# Patient Record
Sex: Female | Born: 1975 | Race: Black or African American | Hispanic: No | Marital: Single | Smoking: Never smoker
Health system: Southern US, Community
[De-identification: ages and names within clinical notes are randomized; demographics above are authoritative.]

## PROBLEM LIST (undated history)

## (undated) DIAGNOSIS — Z973 Presence of spectacles and contact lenses: Secondary | ICD-10-CM

## (undated) DIAGNOSIS — F419 Anxiety disorder, unspecified: Secondary | ICD-10-CM

## (undated) DIAGNOSIS — K219 Gastro-esophageal reflux disease without esophagitis: Secondary | ICD-10-CM

## (undated) DIAGNOSIS — S63399A Traumatic rupture of other ligament of unspecified wrist, initial encounter: Secondary | ICD-10-CM

## (undated) DIAGNOSIS — E7800 Pure hypercholesterolemia, unspecified: Secondary | ICD-10-CM

## (undated) DIAGNOSIS — I10 Essential (primary) hypertension: Secondary | ICD-10-CM

## (undated) DIAGNOSIS — R7303 Prediabetes: Secondary | ICD-10-CM

## (undated) DIAGNOSIS — L309 Dermatitis, unspecified: Secondary | ICD-10-CM

## (undated) HISTORY — PX: HERNIA REPAIR: SHX51

## (undated) HISTORY — PX: TUBAL LIGATION: SHX77

---

## 1998-05-12 ENCOUNTER — Encounter: Admission: RE | Admit: 1998-05-12 | Discharge: 1998-08-10 | Payer: Self-pay | Admitting: Radiation Oncology

## 1998-05-27 ENCOUNTER — Emergency Department (HOSPITAL_COMMUNITY): Admission: EM | Admit: 1998-05-27 | Discharge: 1998-05-27 | Payer: Self-pay | Admitting: Emergency Medicine

## 1998-05-27 ENCOUNTER — Encounter: Payer: Self-pay | Admitting: Emergency Medicine

## 1999-05-25 ENCOUNTER — Emergency Department (HOSPITAL_COMMUNITY): Admission: EM | Admit: 1999-05-25 | Discharge: 1999-05-25 | Payer: Self-pay | Admitting: Emergency Medicine

## 1999-05-25 ENCOUNTER — Encounter: Payer: Self-pay | Admitting: Emergency Medicine

## 2001-06-29 ENCOUNTER — Inpatient Hospital Stay (HOSPITAL_COMMUNITY): Admission: AD | Admit: 2001-06-29 | Discharge: 2001-07-02 | Payer: Self-pay | Admitting: Obstetrics and Gynecology

## 2001-08-13 ENCOUNTER — Other Ambulatory Visit: Admission: RE | Admit: 2001-08-13 | Discharge: 2001-08-13 | Payer: Self-pay | Admitting: Obstetrics and Gynecology

## 2002-07-25 ENCOUNTER — Inpatient Hospital Stay (HOSPITAL_COMMUNITY): Admission: AD | Admit: 2002-07-25 | Discharge: 2002-07-25 | Payer: Self-pay | Admitting: *Deleted

## 2002-08-06 ENCOUNTER — Other Ambulatory Visit: Admission: RE | Admit: 2002-08-06 | Discharge: 2002-08-06 | Payer: Self-pay | Admitting: Obstetrics and Gynecology

## 2002-12-10 ENCOUNTER — Inpatient Hospital Stay (HOSPITAL_COMMUNITY): Admission: AD | Admit: 2002-12-10 | Discharge: 2002-12-10 | Payer: Self-pay | Admitting: Obstetrics and Gynecology

## 2002-12-10 ENCOUNTER — Encounter: Payer: Self-pay | Admitting: Obstetrics and Gynecology

## 2002-12-25 ENCOUNTER — Inpatient Hospital Stay (HOSPITAL_COMMUNITY): Admission: AD | Admit: 2002-12-25 | Discharge: 2002-12-25 | Payer: Self-pay | Admitting: Obstetrics and Gynecology

## 2002-12-28 ENCOUNTER — Inpatient Hospital Stay (HOSPITAL_COMMUNITY): Admission: AD | Admit: 2002-12-28 | Discharge: 2002-12-31 | Payer: Self-pay | Admitting: Obstetrics and Gynecology

## 2003-01-25 ENCOUNTER — Inpatient Hospital Stay (HOSPITAL_COMMUNITY): Admission: AD | Admit: 2003-01-25 | Discharge: 2003-01-25 | Payer: Self-pay | Admitting: Obstetrics and Gynecology

## 2003-03-18 ENCOUNTER — Other Ambulatory Visit: Admission: RE | Admit: 2003-03-18 | Discharge: 2003-03-18 | Payer: Self-pay | Admitting: Obstetrics and Gynecology

## 2003-06-18 ENCOUNTER — Ambulatory Visit (HOSPITAL_COMMUNITY): Admission: RE | Admit: 2003-06-18 | Discharge: 2003-06-18 | Payer: Self-pay | Admitting: Obstetrics and Gynecology

## 2004-04-05 ENCOUNTER — Other Ambulatory Visit: Admission: RE | Admit: 2004-04-05 | Discharge: 2004-04-05 | Payer: Self-pay | Admitting: Obstetrics and Gynecology

## 2004-08-01 ENCOUNTER — Emergency Department (HOSPITAL_COMMUNITY): Admission: EM | Admit: 2004-08-01 | Discharge: 2004-08-01 | Payer: Self-pay | Admitting: Family Medicine

## 2006-01-14 ENCOUNTER — Emergency Department (HOSPITAL_COMMUNITY): Admission: EM | Admit: 2006-01-14 | Discharge: 2006-01-14 | Payer: Self-pay | Admitting: Family Medicine

## 2006-08-02 ENCOUNTER — Emergency Department (HOSPITAL_COMMUNITY): Admission: EM | Admit: 2006-08-02 | Discharge: 2006-08-02 | Payer: Self-pay | Admitting: Emergency Medicine

## 2012-03-28 ENCOUNTER — Emergency Department (HOSPITAL_COMMUNITY)
Admission: EM | Admit: 2012-03-28 | Discharge: 2012-03-29 | Disposition: A | Discharge status: Home / Self Care | Payer: BC Managed Care – PPO | Attending: Emergency Medicine | Admitting: Emergency Medicine

## 2012-03-28 DIAGNOSIS — Z91012 Allergy to eggs: Secondary | ICD-10-CM | POA: Insufficient documentation

## 2012-03-28 DIAGNOSIS — Z888 Allergy status to other drugs, medicaments and biological substances status: Secondary | ICD-10-CM | POA: Insufficient documentation

## 2012-03-28 DIAGNOSIS — R071 Chest pain on breathing: Secondary | ICD-10-CM | POA: Insufficient documentation

## 2012-03-29 ENCOUNTER — Emergency Department (HOSPITAL_COMMUNITY): Payer: BC Managed Care – PPO

## 2012-03-29 ENCOUNTER — Encounter (HOSPITAL_COMMUNITY): Payer: Self-pay | Admitting: Family Medicine

## 2012-03-29 LAB — COMPREHENSIVE METABOLIC PANEL
AST: 18 U/L (ref 0–37)
Albumin: 3.9 g/dL (ref 3.5–5.2)
Calcium: 9.2 mg/dL (ref 8.4–10.5)
Creatinine, Ser: 0.7 mg/dL (ref 0.50–1.10)
Total Protein: 7.4 g/dL (ref 6.0–8.3)

## 2012-03-29 LAB — CBC
MCHC: 32.8 g/dL (ref 30.0–36.0)
RDW: 13.6 % (ref 11.5–15.5)

## 2012-03-29 LAB — TROPONIN I: Troponin I: <0.3 ng/mL (ref <0.30)

## 2012-03-29 LAB — D-DIMER, QUANTITATIVE: D-Dimer, Quant: <0.27 ug/mL-FEU (ref 0.00–0.48)

## 2012-03-29 MED ORDER — ASPIRIN 81 MG PO CHEW
324.0000 mg | CHEWABLE_TABLET | Freq: Once | ORAL | Status: AC
  Administered 2012-03-29: 324 mg via ORAL
  Filled 2012-03-29: qty 4

## 2012-03-29 MED ORDER — HYDROCODONE-ACETAMINOPHEN 5-500 MG PO TABS: 1.0000 | ORAL_TABLET | Freq: Four times a day (QID) | ORAL | Status: DC | PRN

## 2012-03-29 NOTE — ED Notes (Signed)
Patient transported to X-ray 

## 2012-03-29 NOTE — ED Provider Notes (Signed)
History     CSN: [id number]  Arrival date & time 03/28/12  2326   First MD Initiated Contact with Patient 03/29/12 [phone]      Chief Complaint  Patient presents with  . Chest Pain    (Consider location/radiation/quality/duration/timing/severity/associated sxs/prior treatment) HPI  Patient presents to the emergency department with complaints of chest pain that started 3 weeks ago. She states that she has been working out twice a week but this doesn't feel like a pulled muscle. Tonight the pain got worse with some left arm tingling and some sensations of her left neck. She denies having nausea or vomiting associated with it denies having history of GERD. Denies having any shortness of breath, wheezing, recent illness or having syncopal episode. She admits that her dad had a heart attack at age 90. But denies ever having any problems with chest pain previous lead to these recent episodes. Her vital signs are stable and she is in no acute distress.  History reviewed. No pertinent past medical history.  Past Surgical History  Procedure Date  . Tubal ligation   . Hernia repair     No family history on file.  History  Substance Use Topics  . Smoking status: Never Smoker   . Smokeless tobacco: Not on file  . Alcohol Use: No    OB History    Grav Para Term Preterm Abortions TAB SAB Ect Mult Living                  Review of Systems   Review of Systems  Gen: no weight loss, fevers, chills, night sweats  Eyes: no discharge or drainage, no occular pain or visual changes  Nose: no epistaxis or rhinorrhea  Mouth: no dental pain, no sore throat  Neck: no neck pain  Lungs:No wheezing, coughing or hemoptysis CV: + chest pain,  Negative for palpitations, dependent edema or orthopnea  Abd: no abdominal pain, nausea, vomiting  GU: no dysuria or gross hematuria  MSK:  No abnormalities  Neuro: no headache, no focal neurologic deficits  Skin: no abnormalities Psyche:  negative.    Allergies  Avelox and Eggs or egg-derived products  Home Medications   Current Outpatient Rx  Name Route Sig Dispense Refill  . HYDROCODONE-ACETAMINOPHEN 5-500 MG PO TABS Oral Take 1-2 tablets by mouth every 6 (six) hours as needed for pain. 15 tablet 0    BP 153/93  Pulse 72  Temp 98.6 F (37 C) (Oral)  Resp 9  SpO2 100%  LMP 03/06/2012  Physical Exam  Nursing note and vitals reviewed. Constitutional: She appears well-developed and well-nourished. No distress.  HENT:  Head: Normocephalic and atraumatic.  Eyes: Pupils are equal, round, and reactive to light.  Neck: Normal range of motion. Neck supple.  Cardiovascular: Normal rate and regular rhythm.   Pulmonary/Chest: Effort normal. No respiratory distress. She has no wheezes. She has no rales. She exhibits tenderness (left 3rd and 4th rib space).  Abdominal: Soft.  Neurological: She is alert.  Skin: Skin is warm and dry.    ED Course  Procedures (including critical care time)  Labs Reviewed  COMPREHENSIVE METABOLIC PANEL - Abnormal; Notable for the following:    Glucose, Bld 105 (*)     All other components within normal limits  CBC  TROPONIN I  D-DIMER, QUANTITATIVE  TROPONIN I   Dg Chest 2 View  03/29/2012  *RADIOLOGY REPORT*  Clinical Data: Chest pain and SOB  CHEST - 2 VIEW  Comparison: 08/02/2006  Findings: The heart size and mediastinal contours are within normal limits.  Both lungs are clear.  The visualized skeletal structures are unremarkable.  IMPRESSION: Negative exam.   Original Report Authenticated By: Rosealee Albee, M.D.      1. Costochondral chest pain       MDM  She has had 2 negative troponins, negative d-dimer, normal chest x-ray. On physical examination she is very tender to palpation. I recommend that she see her primary care doctor for an outpatient stress test.  Pt given oral analgesics and patient education.  Pt has been advised of the symptoms that warrant their  return to the ED. Patient has voiced understanding and has agreed to follow-up with the PCP or specialist.    Date: 03/29/2012  Rate: 70  Rhythm: normal sinus rhythm  QRS Axis: normal  Intervals: normal  ST/T Wave abnormalities: nonspecific T wave changes  Conduction Disutrbances:none  Narrative Interpretation:   Old EKG Reviewed: none available        Dorthula Matas, PA 03/29/12 0523  Dorthula Matas, PA 03/29/12 [phone]

## 2012-03-29 NOTE — ED Notes (Signed)
Patient states that she started having chest pain 3 weeks ago. States she has been working out but this does not feel like a pulled muscle. Tonite, pain got worse on left side of chest radiating into left arm. States that the left side of her face "feels funny."

## 2012-03-30 NOTE — ED Provider Notes (Signed)
Medical screening examination/treatment/procedure(s) were performed by non-physician practitioner and as supervising physician I was immediately available for consultation/collaboration.  Zuleima Haser, MD 03/30/12 0750 

## 2012-10-21 ENCOUNTER — Encounter (HOSPITAL_COMMUNITY): Payer: Self-pay | Admitting: Emergency Medicine

## 2012-10-21 ENCOUNTER — Emergency Department (HOSPITAL_COMMUNITY)
Admission: EM | Admit: 2012-10-21 | Discharge: 2012-10-21 | Disposition: A | Discharge status: Home / Self Care | Payer: BC Managed Care – PPO | Attending: Emergency Medicine | Admitting: Emergency Medicine

## 2012-10-21 DIAGNOSIS — Z79899 Other long term (current) drug therapy: Secondary | ICD-10-CM | POA: Insufficient documentation

## 2012-10-21 DIAGNOSIS — M545 Low back pain, unspecified: Secondary | ICD-10-CM | POA: Insufficient documentation

## 2012-10-21 DIAGNOSIS — Y9389 Activity, other specified: Secondary | ICD-10-CM | POA: Insufficient documentation

## 2012-10-21 DIAGNOSIS — E7800 Pure hypercholesterolemia, unspecified: Secondary | ICD-10-CM | POA: Insufficient documentation

## 2012-10-21 DIAGNOSIS — R51 Headache: Secondary | ICD-10-CM | POA: Insufficient documentation

## 2012-10-21 DIAGNOSIS — Y92410 Unspecified street and highway as the place of occurrence of the external cause: Secondary | ICD-10-CM | POA: Insufficient documentation

## 2012-10-21 DIAGNOSIS — IMO0002 Reserved for concepts with insufficient information to code with codable children: Secondary | ICD-10-CM | POA: Insufficient documentation

## 2012-10-21 HISTORY — DX: Pure hypercholesterolemia, unspecified: E78.00

## 2012-10-21 MED ORDER — CYCLOBENZAPRINE HCL 10 MG PO TABS: 10.0000 mg | ORAL_TABLET | Freq: Two times a day (BID) | ORAL | Status: DC | PRN
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
## 2012-10-23 NOTE — ED Provider Notes (Signed)
Medical screening examination/treatment/procedure(s) were performed by non-physician practitioner and as supervising physician I was immediately available for consultation/collaboration.   Laray Anger, DO 10/23/12 [phone]

## 2013-04-16 ENCOUNTER — Ambulatory Visit (INDEPENDENT_AMBULATORY_CARE_PROVIDER_SITE_OTHER): Payer: BC Managed Care – PPO | Admitting: Physician Assistant

## 2013-04-16 VITALS — BP 108/80 | HR 74 | Temp 98.2°F | Resp 18 | Ht 63.0 in | Wt 164.8 lb

## 2013-04-16 DIAGNOSIS — J069 Acute upper respiratory infection, unspecified: Secondary | ICD-10-CM

## 2013-04-16 MED ORDER — BENZONATATE 100 MG PO CAPS: 100.0000 mg | ORAL_CAPSULE | Freq: Three times a day (TID) | ORAL | Status: DC | PRN

## 2013-04-16 MED ORDER — HYDROCODONE-HOMATROPINE 5-1.5 MG/5ML PO SYRP: 5.0000 mL | ORAL_SOLUTION | Freq: Three times a day (TID) | ORAL | Status: DC | PRN

## 2013-04-16 MED ORDER — IPRATROPIUM BROMIDE 0.03 % NA SOLN: 2.0000 | Freq: Two times a day (BID) | NASAL | Status: DC

## 2013-04-16 MED ORDER — CETIRIZINE HCL 10 MG PO TABS: 10.0000 mg | ORAL_TABLET | Freq: Every day | ORAL | Status: DC

## 2013-04-16 NOTE — Progress Notes (Signed)
  Subjective:    Patient ID: Eldena Dede, female    DOB: 29-Jul-1975, 37 y.o.   MRN: [id number]  HPI    Ms. Luisa Hart is a very pleasant 37 yr old female here with concern for illness.  Reports she has a cough that started about 6 days ago.  Has gotten worse since.  Thought was just allergies at first.  Also has some runny nose, post-nasal drainage.  The cough is productive of mucus.  Feels a little SOB occ but no wheezing.  No asthma.  No fever.  ST from coughing.  +HA.  Cough is very bothersome, keeps awake at night.  Really does not feel bad, just with nagging cough.  Has used Dayquil and Nyquil with little relief.  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  Review of Systems  Constitutional: Negative for fever and chills.  HENT: Positive for congestion, rhinorrhea and sore throat. Negative for ear pain.   Respiratory: Positive for cough. Negative for shortness of breath and wheezing.   Cardiovascular: Negative.   Gastrointestinal: Negative.   Musculoskeletal: Negative.   Skin: Negative.   Neurological: Negative.        Objective:   Physical Exam  Vitals reviewed. Constitutional: She is oriented to person, place, and time. She appears well-developed and well-nourished. No distress.  HENT:  Head: Normocephalic and atraumatic.  Eyes: Conjunctivae are normal. No scleral icterus.  Neck: Neck supple.  Cardiovascular: Normal rate, regular rhythm and normal heart sounds.  Exam reveals no gallop and no friction rub.   No murmur heard. Pulmonary/Chest: Effort normal and breath sounds normal. She has no wheezes. She has no rales.  Lymphadenopathy:    She has no cervical adenopathy.  Neurological: She is alert and oriented to person, place, and time.  Skin: Skin is warm and dry.  Psychiatric: She has a normal mood and affect. Her behavior is normal.       Assessment & Plan:  Viral URI with cough - Plan: benzonatate (TESSALON) 100 MG capsule, HYDROcodone-homatropine (HYCODAN)  5-1.5 MG/5ML syrup, ipratropium (ATROVENT) 0.03 % nasal spray, cetirizine (ZYRTEC) 10 MG tablet   Ms. Luisa Hart is a very pleasant 37 yr old female with URI, cough.  Suspect viral etiology.  Afebrile, VSS, lungs CTA, throat clear.  Will treat symptoms with zyrtec, atrovent, tessalon, hycodan.  Push fluids, rest.  Work note provided for today and tomorrow if needed.  Discussed RTC precautions including fever, SOB, wheezing, worsening cough.  Pt understands and is in agreement.

## 2013-04-16 NOTE — Patient Instructions (Signed)
Begin using cetirizine (Zyrtec) once daily to help with nasal congestion, runny nose, sneezing, post-nasal drainage  Use the ipratropium (Atrovent) nasal 2-3 times per day to help with runny nose, congestion, post-nasal  Benzonatate (Tessalon) every 8 hours as needed for cough  Can also use Delsym or Robitussin for cough during the day  Use Hycodan syrup for cough at bed time - this may make you sleepy so be careful with the first dose  Plenty of fluids (water is best!) and rest  Please let us know if any symptoms are worsening or not improving   Upper Respiratory Infection, Adult An upper respiratory infection (URI) is also sometimes known as the common cold. The upper respiratory tract includes the nose, sinuses, throat, trachea, and bronchi. Bronchi are the airways leading to the lungs. Most people improve within 1 week, but symptoms can last up to 2 weeks. A residual cough may last even longer.  CAUSES Many different viruses can infect the tissues lining the upper respiratory tract. The tissues become irritated and inflamed and often become very moist. Mucus production is also common. A cold is contagious. You can easily spread the virus to others by oral contact. This includes kissing, sharing a glass, coughing, or sneezing. Touching your mouth or nose and then touching a surface, which is then touched by another person, can also spread the virus. SYMPTOMS  Symptoms typically develop 1 to 3 days after you come in contact with a cold virus. Symptoms vary from person to person. They may include:  Runny nose.  Sneezing.  Nasal congestion.  Sinus irritation.  Sore throat.  Loss of voice (laryngitis).  Cough.  Fatigue.  Muscle aches.  Loss of appetite.  Headache.  Low-grade fever. DIAGNOSIS  You might diagnose your own cold based on familiar symptoms, since most people get a cold 2 to 3 times a year. Your caregiver can confirm this based on your exam. Most importantly,  your caregiver can check that your symptoms are not due to another disease such as strep throat, sinusitis, pneumonia, asthma, or epiglottitis. Blood tests, throat tests, and X-rays are not necessary to diagnose a common cold, but they may sometimes be helpful in excluding other more serious diseases. Your caregiver will decide if any further tests are required. RISKS AND COMPLICATIONS  You may be at risk for a more severe case of the common cold if you smoke cigarettes, have chronic heart disease (such as heart failure) or lung disease (such as asthma), or if you have a weakened immune system. The very young and very old are also at risk for more serious infections. Bacterial sinusitis, middle ear infections, and bacterial pneumonia can complicate the common cold. The common cold can worsen asthma and chronic obstructive pulmonary disease (COPD). Sometimes, these complications can require emergency medical care and may be life-threatening. PREVENTION  The best way to protect against getting a cold is to practice good hygiene. Avoid oral or hand contact with people with cold symptoms. Wash your hands often if contact occurs. There is no clear evidence that vitamin C, vitamin E, echinacea, or exercise reduces the chance of developing a cold. However, it is always recommended to get plenty of rest and practice good nutrition. TREATMENT  Treatment is directed at relieving symptoms. There is no cure. Antibiotics are not effective, because the infection is caused by a virus, not by bacteria. Treatment may include:  Increased fluid intake. Sports drinks offer valuable electrolytes, sugars, and fluids.  Breathing heated mist or  steam (vaporizer or shower).  Eating chicken soup or other clear broths, and maintaining good nutrition.  Getting plenty of rest.  Using gargles or lozenges for comfort.  Controlling fevers with ibuprofen or acetaminophen as directed by your caregiver.  Increasing usage of your  inhaler if you have asthma. Zinc gel and zinc lozenges, taken in the first 24 hours of the common cold, can shorten the duration and lessen the severity of symptoms. Pain medicines may help with fever, muscle aches, and throat pain. A variety of non-prescription medicines are available to treat congestion and runny nose. Your caregiver can make recommendations and may suggest nasal or lung inhalers for other symptoms.  HOME CARE INSTRUCTIONS   Only take over-the-counter or prescription medicines for pain, discomfort, or fever as directed by your caregiver.  Use a warm mist humidifier or inhale steam from a shower to increase air moisture. This may keep secretions moist and make it easier to breathe.  Drink enough water and fluids to keep your urine clear or pale yellow.  Rest as needed.  Return to work when your temperature has returned to normal or as your caregiver advises. You may need to stay home longer to avoid infecting others. You can also use a face mask and careful hand washing to prevent spread of the virus. SEEK MEDICAL CARE IF:   After the first few days, you feel you are getting worse rather than better.  You need your caregiver's advice about medicines to control symptoms.  You develop chills, worsening shortness of breath, or brown or red sputum. These may be signs of pneumonia.  You develop yellow or brown nasal discharge or pain in the face, especially when you bend forward. These may be signs of sinusitis.  You develop a fever, swollen neck glands, pain with swallowing, or white areas in the back of your throat. These may be signs of strep throat. SEEK IMMEDIATE MEDICAL CARE IF:   You have a fever.  You develop severe or persistent headache, ear pain, sinus pain, or chest pain.  You develop wheezing, a prolonged cough, cough up blood, or have a change in your usual mucus (if you have chronic lung disease).  You develop sore muscles or a stiff neck. Document  Released: 12/19/2000 Document Revised: 09/17/2011 Document Reviewed: 10/27/2010 [facility] Patient Information 2014 [place], [place].

## 2013-05-02 ENCOUNTER — Ambulatory Visit (INDEPENDENT_AMBULATORY_CARE_PROVIDER_SITE_OTHER): Payer: BC Managed Care – PPO | Admitting: Family Medicine

## 2013-05-02 ENCOUNTER — Ambulatory Visit: Payer: BC Managed Care – PPO

## 2013-05-02 VITALS — BP 128/82 | HR 76 | Temp 98.9°F | Resp 16 | Ht 63.0 in | Wt 162.0 lb

## 2013-05-02 DIAGNOSIS — T148XXA Other injury of unspecified body region, initial encounter: Secondary | ICD-10-CM

## 2013-05-02 DIAGNOSIS — M79645 Pain in left finger(s): Secondary | ICD-10-CM

## 2013-05-02 DIAGNOSIS — M79609 Pain in unspecified limb: Secondary | ICD-10-CM

## 2013-05-02 DIAGNOSIS — M79642 Pain in left hand: Secondary | ICD-10-CM

## 2013-05-02 NOTE — Progress Notes (Signed)
[facility]:  Office Visit  Chief Complaint:  Chief Complaint  Patient presents with  . Hand Injury    L thumb x last night, used ice     HPI: Melinda Valentine is a 37 y.o. female who is here for  3/10 pain and swelling of left base of thumb s/p running into wall, running and jammed her finger into the door.  She has dull pain, intermittent, worse when she moves. + swelling and bruising . No n/w/t Has tried ice for it.  No osteopenia/osteoprosis or Lupus.  No prior hand injury LMP 101/11/4  Past Medical History  Diagnosis Date  . Hypercholesteremia    Past Surgical History  Procedure Laterality Date  . Tubal ligation    . Hernia repair     History   Social History  . Marital Status: Married    Spouse Name: N/A    Number of Children: N/A  . Years of Education: N/A   Social History Main Topics  . Smoking status: Never Smoker   . Smokeless tobacco: None  . Alcohol Use: No  . Drug Use: No  . Sexual Activity: Yes   Other Topics Concern  . None   Social History Narrative  . None   Family History  Problem Relation Age of Onset  . Hyperlipidemia Mother   . Heart disease Father   . Cancer Maternal Grandmother   . Cancer Maternal Aunt    Allergies  Allergen Reactions  . Avelox [Moxifloxacin Hcl In Nacl] Other (See Comments)    "body feels numb"  . Eggs Or Egg-Derived Products Nausea And Vomiting   Prior to Admission medications   Medication Sig Start Date End Date Taking? Authorizing Provider  ipratropium (ATROVENT) 0.03 % nasal spray Place 2 sprays into the nose 2 (two) times daily as needed. 04/16/13  Yes Eleanore E Debbra Riding, PA-C     ROS: The patient denies fevers, chills, night sweats, unintentional weight loss, chest pain, palpitations, wheezing, dyspnea on exertion, nausea, vomiting, abdominal pain, dysuria, hematuria, melena, numbness, weakness, or tingling.  All other systems have been reviewed and were otherwise negative with the  exception of those mentioned in the HPI and as above.    PHYSICAL EXAM: Filed Vitals:   05/02/13 1504  BP: 128/82  Pulse: 76  Temp: 98.9 F (37.2 C)  Resp: 16   Filed Vitals:   05/02/13 1504  Height: 5\' 3"  (1.6 m)  Weight: 162 lb (73.483 kg)   Body mass index is 28.7 kg/(m^2).  General: Alert, no acute distress HEENT:  Normocephalic, atraumatic, oropharynx patent. EOMI, PERRLA Cardiovascular:  Regular rate and rhythm, no rubs murmurs or gallops.  No Carotid bruits, radial pulse intact. No pedal edema.  Respiratory: Clear to auscultation bilaterally.  No wheezes, rales, or rhonchi.  No cyanosis, no use of accessory musculature GI: No organomegaly, abdomen is soft and non-tender, positive bowel sounds.  No masses. Skin: No rashes. Neurologic: Facial musculature symmetric. Psychiatric: Patient is appropriate throughout our interaction. Lymphatic: No cervical lymphadenopathy Musculoskeletal: Gait intact. Left base thumb tenderness UCL was intact 5/5 strength, sesntation intact + radial pulse  LABS: Results for orders placed during the hospital encounter of 03/28/12  CBC      Result Value Range   WBC 6.9  4.0 - 10.5 K/uL   RBC 5.05  3.87 - 5.11 MIL/uL   Hemoglobin 13.3  12.0 - 15.0 g/dL   HCT 16.1  09.6 - 04.5 %   MCV 80.2  78.0 - 100.0 fL   MCH 26.3  26.0 - 34.0 pg   MCHC 32.8  30.0 - 36.0 g/dL   RDW 16.1  09.6 - 04.5 %   Platelets 211  150 - 400 K/uL  TROPONIN I      Result Value Range   Troponin I <0.30  <0.30 ng/mL  D-DIMER, QUANTITATIVE      Result Value Range   D-Dimer, Quant <0.27  0.00 - 0.48 ug/mL-FEU  COMPREHENSIVE METABOLIC PANEL      Result Value Range   Sodium 138  135 - 145 mEq/L   Potassium 3.6  3.5 - 5.1 mEq/L   Chloride 103  96 - 112 mEq/L   CO2 25  19 - 32 mEq/L   Glucose, Bld 105 (*) 70 - 99 mg/dL   BUN 8  6 - 23 mg/dL   Creatinine, Ser 4.09  0.50 - 1.10 mg/dL   Calcium 9.2  8.4 - 81.1 mg/dL   Total Protein 7.4  6.0 - 8.3 g/dL   Albumin 3.9   3.5 - 5.2 g/dL   AST 18  0 - 37 U/L   ALT 28  0 - 35 U/L   Alkaline Phosphatase 62  39 - 117 U/L   Total Bilirubin 0.5  0.3 - 1.2 mg/dL   GFR calc non Af Amer >90  >90 mL/min   GFR calc Af Amer >90  >90 mL/min  TROPONIN I      Result Value Range   Troponin I <0.30  <0.30 ng/mL     EKG/XRAY:   Primary read interpreted by Dr. Conley Rolls at [facility]. No fx/dislocation  ASSESSMENT/PLAN: Encounter Diagnoses  Name Primary?  . Thumb pain, left Yes  . Hand pain, left   . Sprain and strain    Extensor tendon sprain/strain ? MCP spraina dn strain, her UCL is intact Thumb spica and RICE and ibuprofen otc F/u in 1-2 weeks if no improvement or prn Gross sideeffects, risk and benefits, and alternatives of medications d/w patient. Patient is aware that all medications have potential sideeffects and we are unable to predict every sideeffect or drug-drug interaction that may occur.  Hamilton Capri PHUONG, DO 05/02/2013 4:47 PM

## 2018-06-04 ENCOUNTER — Emergency Department (HOSPITAL_COMMUNITY)
Admission: EM | Admit: 2018-06-04 | Discharge: 2018-06-04 | Disposition: A | Discharge status: Home / Self Care | Payer: Commercial Managed Care - PPO | Attending: Emergency Medicine | Admitting: Emergency Medicine

## 2018-06-04 ENCOUNTER — Encounter (HOSPITAL_COMMUNITY): Payer: Self-pay

## 2018-06-04 ENCOUNTER — Emergency Department (HOSPITAL_COMMUNITY): Payer: Commercial Managed Care - PPO

## 2018-06-04 DIAGNOSIS — W01198A Fall on same level from slipping, tripping and stumbling with subsequent striking against other object, initial encounter: Secondary | ICD-10-CM | POA: Diagnosis not present

## 2018-06-04 DIAGNOSIS — Y9389 Activity, other specified: Secondary | ICD-10-CM | POA: Insufficient documentation

## 2018-06-04 DIAGNOSIS — M25532 Pain in left wrist: Secondary | ICD-10-CM | POA: Diagnosis not present

## 2018-06-04 DIAGNOSIS — Y998 Other external cause status: Secondary | ICD-10-CM | POA: Diagnosis not present

## 2018-06-04 DIAGNOSIS — M25512 Pain in left shoulder: Secondary | ICD-10-CM | POA: Diagnosis not present

## 2018-06-04 DIAGNOSIS — S161XXA Strain of muscle, fascia and tendon at neck level, initial encounter: Secondary | ICD-10-CM | POA: Diagnosis not present

## 2018-06-04 DIAGNOSIS — S199XXA Unspecified injury of neck, initial encounter: Secondary | ICD-10-CM | POA: Diagnosis present

## 2018-06-04 DIAGNOSIS — W19XXXA Unspecified fall, initial encounter: Secondary | ICD-10-CM

## 2018-06-04 DIAGNOSIS — Y9289 Other specified places as the place of occurrence of the external cause: Secondary | ICD-10-CM | POA: Diagnosis not present

## 2018-06-04 MED ORDER — IBUPROFEN 800 MG PO TABS: 800.0000 mg | ORAL_TABLET | Freq: Three times a day (TID) | ORAL | 0 refills | Status: DC

## 2018-06-04 MED ORDER — CYCLOBENZAPRINE HCL 10 MG PO TABS: 10.0000 mg | ORAL_TABLET | Freq: Two times a day (BID) | ORAL | 0 refills | Status: DC | PRN

## 2018-06-04 NOTE — ED Triage Notes (Signed)
Pt states that she had a mechanical fall tonight while getting something out of a storage building, c/o of pain to bilateral shoulders, hit head, no LOC, not on blood thinners. C/o of headache.

## 2018-06-04 NOTE — ED Notes (Signed)
ED Provider at bedside. 

## 2018-06-04 NOTE — ED Provider Notes (Signed)
[facility]   CSN: [id number] Arrival date & time: 06/04/18  2112     History   Chief Complaint Chief Complaint  Patient presents with  . Fall    HPI Melinda Valentine is a 42 y.o. female.  Patient presents to the emergency department with a chief complaint of slip and fall.  She states that she slipped on wet wood and fell backward landing on her back.  She did hit her head.  She is uncertain whether she passed out.  She denies any vomiting.  Denies any seizure.  Denies any weakness, numbness, or tingling.  Denies any vision changes or slurred speech.  She complains of left shoulder pain and left forearm pain.  She also complains of muscle soreness in her neck and back.  She denies any other injuries.  She has not taken anything for symptoms.  Her symptoms are aggravated with movement and palpation.  The history is provided by the patient. No language interpreter was used.    Past Medical History:  Diagnosis Date  . Hypercholesteremia     There are no active problems to display for this patient.   Past Surgical History:  Procedure Laterality Date  . HERNIA REPAIR    . TUBAL LIGATION       OB History   None      Home Medications    Prior to Admission medications   Medication Sig Start Date End Date Taking? Authorizing Provider  ipratropium (ATROVENT) 0.03 % nasal spray Place 2 sprays into the nose 2 (two) times daily as needed. 04/16/13   Godfrey PickEgan, Eleanore E, PA-C    Family History Family History  Problem Relation Age of Onset  . Hyperlipidemia Mother   . Heart disease Father   . Cancer Maternal Grandmother   . Cancer Maternal Aunt     Social History Social History   Tobacco Use  . Smoking status: Never Smoker  . Smokeless tobacco: Never Used  Substance Use Topics  . Alcohol use: No  . Drug use: No     Allergies   Avelox [moxifloxacin hcl in nacl] and Eggs or egg-derived products   Review of  Systems Review of Systems  All other systems reviewed and are negative.    Physical Exam Updated Vital Signs BP 136/89   Pulse 85   Temp 98.5 F (36.9 C) (Oral)   Resp 20   LMP 06/04/2018   SpO2 96%   Physical Exam  Constitutional: She is oriented to person, place, and time. No distress.  HENT:  Head: Normocephalic and atraumatic.  No battle sign, no evidence of skull fracture  Eyes: Pupils are equal, round, and reactive to light. Conjunctivae and EOM are normal.  Neck: Normal range of motion. No tracheal deviation present.  Normal range of motion and strength neck, no cervical spine tenderness, step-off, or deformity  Cardiovascular: Normal rate.  Pulmonary/Chest: Effort normal. No respiratory distress.  Abdominal: Soft.  Musculoskeletal: Normal range of motion.  Normal range of motion of left upper extremity, though there is some tenderness to palpation over the scapula and left medial elbow and left wrist diffusely, no bony abnormality or deformity  Neurological: She is alert and oriented to person, place, and time.  Skin: Skin is warm and dry. She is not diaphoretic.  Psychiatric: Judgment normal.  Nursing note and vitals reviewed.    ED Treatments / Results  Labs (all labs ordered are listed, but only abnormal results are  displayed) Labs Reviewed - No data to display  EKG None  Radiology No results found.  Procedures Procedures (including critical care time)  Medications Ordered in ED Medications - No data to display   Initial Impression / Assessment and Plan / ED Course  I have reviewed the triage vital signs and the nursing notes.  Pertinent labs & imaging results that were available during my care of the patient were reviewed by me and considered in my medical decision making (see chart for details).     Patient with mechanical fall.  She slipped and fell on wet decking.  Landed on her back.  Did not pass out.  Complains of left shoulder and left  wrist pain.  Imaging is negative.  Likely just sprains and strains.  Will splint wrist for comfort.  Recommend PCP follow-up.  Final Clinical Impressions(s) / ED Diagnoses   Final diagnoses:  Fall, initial encounter  Acute pain of left shoulder  Strain of neck muscle, initial encounter  Left wrist pain    ED Discharge Orders         Ordered    ibuprofen (ADVIL,MOTRIN) 800 MG tablet  3 times daily     06/04/18 2259    cyclobenzaprine (FLEXERIL) 10 MG tablet  2 times daily PRN     06/04/18 2259           Roxy Horseman, PA-C 06/04/18 2300    Cathren Laine, MD 06/05/18 0005

## 2019-06-13 DIAGNOSIS — Z8616 Personal history of COVID-19: Secondary | ICD-10-CM

## 2019-06-13 HISTORY — DX: Personal history of COVID-19: Z86.16

## 2019-09-03 ENCOUNTER — Ambulatory Visit: Payer: Commercial Managed Care - PPO | Attending: Internal Medicine

## 2019-09-03 DIAGNOSIS — Z23 Encounter for immunization: Secondary | ICD-10-CM | POA: Insufficient documentation

## 2019-09-03 NOTE — Progress Notes (Signed)
   Covid-19 Vaccination Clinic  Name:  Melinda Valentine    MRN: [id number] DOB: Sep 19, 1975  09/03/2019  Melinda Valentine was observed post Covid-19 immunization for 15 minutes without incidence. She was provided with Vaccine Information Sheet and instruction to access the V-Safe system.   Melinda Valentine was instructed to call 911 with any severe reactions post vaccine: Marland Kitchen Difficulty breathing  . Swelling of your face and throat  . A fast heartbeat  . A bad rash all over your body  . Dizziness and weakness    Immunizations Administered    Name Date Dose VIS Date Route   Pfizer COVID-19 Vaccine 09/03/2019  2:11 PM 0.3 mL 06/19/2019 Intramuscular   Manufacturer: [employer], [employer]   Lot: [id number]   NDC: [address]-9

## 2019-09-29 ENCOUNTER — Ambulatory Visit: Payer: Commercial Managed Care - PPO | Attending: Internal Medicine

## 2019-09-29 DIAGNOSIS — Z23 Encounter for immunization: Secondary | ICD-10-CM

## 2019-09-29 NOTE — Progress Notes (Signed)
   Covid-19 Vaccination Clinic  Name:  Melinda Valentine    MRN: [id number] DOB: 08/14/1975  09/29/2019  [name] was observed post Covid-19 immunization for 15 minutes without incident. She was provided with Vaccine Information Sheet and instruction to access the V-Safe system.   [name] was instructed to call 911 with any severe reactions post vaccine: Marland Kitchen Difficulty breathing  . Swelling of face and throat  . A fast heartbeat  . A bad rash all over body  . Dizziness and weakness   Immunizations Administered    Name Date Dose VIS Date Route   Pfizer COVID-19 Vaccine 09/29/2019  9:45 AM 0.3 mL 06/19/2019 Intramuscular   Manufacturer: [employer], [employer]   Lot: [id number]   NDC: [address]-2      Covid-19 Vaccination Clinic  Name:  Melinda Valentine    MRN: [id number] DOB: 1976/06/28  09/29/2019  [name] was observed post Covid-19 immunization for 15 minutes without incident. She was provided with Vaccine Information Sheet and instruction to access the V-Safe system.   [name] was instructed to call 911 with any severe reactions post vaccine: Marland Kitchen Difficulty breathing  . Swelling of face and throat  . A fast heartbeat  . A bad rash all over body  . Dizziness and weakness   Immunizations Administered    Name Date Dose VIS Date Route   Pfizer COVID-19 Vaccine 09/29/2019  9:45 AM 0.3 mL 06/19/2019 Intramuscular   Manufacturer: [employer], [employer]   Lot: [id number]   NDC: [address]-4

## 2021-04-04 DIAGNOSIS — S63399A Traumatic rupture of other ligament of unspecified wrist, initial encounter: Secondary | ICD-10-CM

## 2021-04-04 HISTORY — DX: Traumatic rupture of other ligament of unspecified wrist, initial encounter: S63.399A

## 2021-04-14 ENCOUNTER — Encounter (HOSPITAL_BASED_OUTPATIENT_CLINIC_OR_DEPARTMENT_OTHER): Payer: Self-pay | Admitting: Orthopedic Surgery

## 2021-04-17 ENCOUNTER — Encounter (HOSPITAL_BASED_OUTPATIENT_CLINIC_OR_DEPARTMENT_OTHER): Payer: Self-pay | Admitting: Orthopedic Surgery

## 2021-04-17 ENCOUNTER — Other Ambulatory Visit: Payer: Self-pay

## 2021-04-17 NOTE — Progress Notes (Signed)
Spoke w/ via phone for pre-op interview---pt Lab needs dos----   I stat, ekg urine poct per anesthesia surgery orders pending            Lab results------none COVID test -----patient states asymptomatic no test needed Arrive at -------845 am 04-20-2021 NPO after MN NO Solid Food.  Clear liquids from MN until---745 am Med rec completed Medications to take morning of surgery -----albuterol inhler prn/use bring, fluoxetine, claritin, famotidine Diabetic medication -----n/a Patient instructed no nail polish to be worn day of surgery Patient instructed to bring photo id and insurance card day of surgery Patient aware to have Driver (ride ) / caregiver    for 24 hours after surgery  partner Melinda Valentine Patient Special Instructions -----none Pre-Op special Istructions -----surgery orders requested lm with Morrie Sheldon Patient verbalized understanding of instructions that were given at this phone interview. Patient denies shortness of breath, chest pain, fever, cough at this phone interview.

## 2021-04-18 NOTE — H&P (Signed)
Preoperative History & Physical Exam  Surgeon: Philipp Ovens, MD  Diagnosis: right scapholunate ligament rupture  Planned Procedure: Procedure(s) (LRB): Right scapholunate ligament repair possible reconstruction with palmaris longus tendon (Right)  History of Present Illness:   Patient is a 45 y.o. female with symptoms consistent with  right scapholunate ligament rupture who presents for surgical intervention. The risks, benefits and alternatives of surgical intervention were discussed and informed consent was obtained prior to surgery.  Past Medical History:  Past Medical History:  Diagnosis Date   Anxiety    Eczema    GERD (gastroesophageal reflux disease)    History of COVID-19 06/13/2019   positive result in care everywhere fatigue sob all symptoms resolved   Hypercholesteremia    Hypertension    Pre-diabetes    last hemaglobin a 1 c 5. 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 Authorizing Provider  albuterol (VENTOLIN HFA) 108 (90 Base) MCG/ACT inhaler Inhale 2 puffs into the lungs every 6 (six) hours as needed for wheezing or shortness of breath.   Yes [provider]  atorvastatin (LIPITOR) 40 MG tablet Take 40 mg by mouth at bedtime.   Yes [provider]  famotidine (PEPCID) 20 MG tablet Take 20 mg by mouth 2 (two) times daily.   Yes [provider]  FLUoxetine (PROZAC) 20 MG tablet Take 20 mg by mouth daily.   Yes [provider]  fluticasone (FLONASE) 50 MCG/ACT nasal spray Place 2 sprays into both nostrils daily as needed for allergies or rhinitis.   Yes [provider]  hydrochlorothiazide (HYDRODIURIL) 25 MG tablet Take 25 mg by mouth daily.   Yes [provider]  levonorgestrel (MIRENA) 20 MCG/DAY IUD 1 each by Intrauterine route once.   Yes [provider]  loratadine (CLARITIN) 10 MG tablet Take 10 mg by mouth daily.   Yes [provider]  meloxicam (MOBIC) 7.5 MG tablet Take 7.5 mg by mouth every evening.   Yes [provider]  triamcinolone cream (KENALOG) 0.5 % Apply 1 application topically as needed.   Yes [provider]    Allergies:  Avelox [moxifloxacin hcl in nacl] and Eggs or egg-derived products  Review of Systems: Negative except per HPI.  Physical Exam: Alert and oriented, NAD Head and neck: no masses, normal alignment CV: pulse intact Pulm: no increased work of breathing, respirations even and unlabored Abdomen: non-distended Extremities: extremities warm and well perfused  LABS: No results found for this or any previous visit (from the past 2160 hour(s)).   Complete History and Physical exam available in the office notes  Gomez Cleverly

## 2021-04-20 ENCOUNTER — Ambulatory Visit (HOSPITAL_BASED_OUTPATIENT_CLINIC_OR_DEPARTMENT_OTHER): Payer: Commercial Managed Care - PPO | Admitting: Anesthesiology

## 2021-04-20 ENCOUNTER — Ambulatory Visit (HOSPITAL_BASED_OUTPATIENT_CLINIC_OR_DEPARTMENT_OTHER)
Admission: RE | Admit: 2021-04-20 | Discharge: 2021-04-20 | Disposition: A | Discharge status: Home / Self Care | Payer: Commercial Managed Care - PPO | Source: Ambulatory Visit | Attending: Orthopedic Surgery | Admitting: Orthopedic Surgery

## 2021-04-20 ENCOUNTER — Encounter (HOSPITAL_BASED_OUTPATIENT_CLINIC_OR_DEPARTMENT_OTHER): Payer: Self-pay | Admitting: Orthopedic Surgery

## 2021-04-20 ENCOUNTER — Encounter (HOSPITAL_BASED_OUTPATIENT_CLINIC_OR_DEPARTMENT_OTHER)
Admission: RE | Disposition: A | Discharge status: Home / Self Care | Payer: Self-pay | Source: Ambulatory Visit | Attending: Orthopedic Surgery

## 2021-04-20 DIAGNOSIS — Z881 Allergy status to other antibiotic agents status: Secondary | ICD-10-CM | POA: Diagnosis not present

## 2021-04-20 DIAGNOSIS — Z793 Long term (current) use of hormonal contraceptives: Secondary | ICD-10-CM | POA: Insufficient documentation

## 2021-04-20 DIAGNOSIS — Z975 Presence of (intrauterine) contraceptive device: Secondary | ICD-10-CM | POA: Diagnosis not present

## 2021-04-20 DIAGNOSIS — X58XXXA Exposure to other specified factors, initial encounter: Secondary | ICD-10-CM | POA: Diagnosis not present

## 2021-04-20 DIAGNOSIS — Z791 Long term (current) use of non-steroidal anti-inflammatories (NSAID): Secondary | ICD-10-CM | POA: Insufficient documentation

## 2021-04-20 DIAGNOSIS — Z8616 Personal history of COVID-19: Secondary | ICD-10-CM | POA: Diagnosis not present

## 2021-04-20 DIAGNOSIS — Z79899 Other long term (current) drug therapy: Secondary | ICD-10-CM | POA: Diagnosis not present

## 2021-04-20 DIAGNOSIS — M25331 Other instability, right wrist: Secondary | ICD-10-CM

## 2021-04-20 DIAGNOSIS — S638X1A Sprain of other part of right wrist and hand, initial encounter: Secondary | ICD-10-CM | POA: Insufficient documentation

## 2021-04-20 HISTORY — DX: Prediabetes: R73.03

## 2021-04-20 HISTORY — DX: Essential (primary) hypertension: I10

## 2021-04-20 HISTORY — DX: Anxiety disorder, unspecified: F41.9

## 2021-04-20 HISTORY — DX: Traumatic rupture of other ligament of unspecified wrist, initial encounter: S63.399A

## 2021-04-20 HISTORY — PX: ULNAR COLLATERAL LIGAMENT REPAIR: SHX6159

## 2021-04-20 HISTORY — DX: Gastro-esophageal reflux disease without esophagitis: K21.9

## 2021-04-20 HISTORY — DX: Dermatitis, unspecified: L30.9

## 2021-04-20 HISTORY — DX: Presence of spectacles and contact lenses: Z97.3

## 2021-04-20 LAB — POCT I-STAT, CHEM 8
BUN: 9 mg/dL (ref 6–20)
Calcium, Ion: 1.22 mmol/L (ref 1.15–1.40)
Chloride: 104 mmol/L (ref 98–111)
Creatinine, Ser: 0.7 mg/dL (ref 0.44–1.00)
Glucose, Bld: 100 mg/dL — ABNORMAL HIGH (ref 70–99)
HCT: 41 % (ref 36.0–46.0)
Hemoglobin: 13.9 g/dL (ref 12.0–15.0)
Potassium: 3.5 mmol/L (ref 3.5–5.1)
Sodium: 143 mmol/L (ref 135–145)
TCO2: 27 mmol/L (ref 22–32)

## 2021-04-20 LAB — POCT PREGNANCY, URINE: Preg Test, Ur: NEGATIVE

## 2021-04-20 SURGERY — REPAIR, LIGAMENT, ULNAR COLLATERAL
Anesthesia: Monitor Anesthesia Care | Site: Wrist | Laterality: Right

## 2021-04-20 MED ORDER — ROPIVACAINE HCL 5 MG/ML IJ SOLN
INTRAMUSCULAR | Status: DC | PRN
  Administered 2021-04-20: 30 mL via PERINEURAL

## 2021-04-20 MED ORDER — CEFAZOLIN SODIUM-DEXTROSE 2-4 GM/100ML-% IV SOLN
2.0000 g | INTRAVENOUS | Status: AC
  Administered 2021-04-20: 2 g via INTRAVENOUS

## 2021-04-20 MED ORDER — OXYCODONE HCL 5 MG PO TABS: 5.0000 mg | ORAL_TABLET | Freq: Once | ORAL | Status: DC | PRN

## 2021-04-20 MED ORDER — FENTANYL CITRATE (PF) 100 MCG/2ML IJ SOLN: 25.0000 ug | INTRAMUSCULAR | Status: DC | PRN

## 2021-04-20 MED ORDER — LACTATED RINGERS IV SOLN: INTRAVENOUS | Status: DC

## 2021-04-20 MED ORDER — MIDAZOLAM HCL 2 MG/2ML IJ SOLN
INTRAMUSCULAR | Status: AC
  Filled 2021-04-20: qty 2

## 2021-04-20 MED ORDER — MIDAZOLAM HCL 2 MG/2ML IJ SOLN
2.0000 mg | Freq: Once | INTRAMUSCULAR | Status: AC
  Administered 2021-04-20: 2 mg via INTRAVENOUS

## 2021-04-20 MED ORDER — WHITE PETROLATUM EX OINT
TOPICAL_OINTMENT | CUTANEOUS | Status: AC
  Filled 2021-04-20: qty 5

## 2021-04-20 MED ORDER — LABETALOL HCL 5 MG/ML IV SOLN
INTRAVENOUS | Status: DC | PRN
  Administered 2021-04-20: 5 mg via INTRAVENOUS

## 2021-04-20 MED ORDER — OXYCODONE HCL 5 MG/5ML PO SOLN: 5.0000 mg | Freq: Once | ORAL | Status: DC | PRN

## 2021-04-20 MED ORDER — FENTANYL CITRATE (PF) 100 MCG/2ML IJ SOLN
INTRAMUSCULAR | Status: AC
  Filled 2021-04-20: qty 2

## 2021-04-20 MED ORDER — ONDANSETRON HCL 4 MG/2ML IJ SOLN: 4.0000 mg | Freq: Once | INTRAMUSCULAR | Status: DC | PRN

## 2021-04-20 MED ORDER — FENTANYL CITRATE (PF) 100 MCG/2ML IJ SOLN
50.0000 ug | Freq: Once | INTRAMUSCULAR | Status: AC
  Administered 2021-04-20: 50 ug via INTRAVENOUS

## 2021-04-20 MED ORDER — PROPOFOL 10 MG/ML IV BOLUS
INTRAVENOUS | Status: DC | PRN
  Administered 2021-04-20: 100 ug/kg/min via INTRAVENOUS

## 2021-04-20 MED ORDER — CEFAZOLIN SODIUM-DEXTROSE 2-4 GM/100ML-% IV SOLN
INTRAVENOUS | Status: AC
  Filled 2021-04-20: qty 100

## 2021-04-20 MED ORDER — OXYCODONE-ACETAMINOPHEN 5-325 MG PO TABS: 1.0000 | ORAL_TABLET | Freq: Four times a day (QID) | ORAL | 0 refills | Status: AC | PRN

## 2021-04-20 SURGICAL SUPPLY — 55 items
ADH SKN CLS APL DERMABOND .7 (GAUZE/BANDAGES/DRESSINGS) ×1
ANCH SUT SWVLOCK 8.5 (Anchor) ×1 IMPLANT
ANCHOR DX SWIVELOCK SL 3.5X8.5 (Anchor) ×1 IMPLANT
ANCHOR REPAIR HAND WRIST (Orthopedic Implant) ×1 IMPLANT
APL SKNCLS STERI-STRIP NONHPOA (GAUZE/BANDAGES/DRESSINGS) ×1
BENZOIN TINCTURE PRP APPL 2/3 (GAUZE/BANDAGES/DRESSINGS) ×2 IMPLANT
BLADE SURG 15 STRL LF DISP TIS (BLADE) ×2 IMPLANT
BLADE SURG 15 STRL SS (BLADE) ×4
BNDG CMPR 9X4 STRL LF SNTH (GAUZE/BANDAGES/DRESSINGS) ×1
BNDG ELASTIC 4X5.8 VLCR STR LF (GAUZE/BANDAGES/DRESSINGS) ×2 IMPLANT
BNDG ESMARK 4X9 LF (GAUZE/BANDAGES/DRESSINGS) ×2 IMPLANT
BNDG PLASTER FAST 3X3 WHT LF (CAST SUPPLIES) ×1 IMPLANT
BNDG PLSTR 3X3 XFST ST WHT LF (CAST SUPPLIES) ×1
CORD BIPOLAR FORCEPS 12FT (ELECTRODE) ×2 IMPLANT
COVER BACK TABLE 60X90IN (DRAPES) ×2 IMPLANT
DECANTER SPIKE VIAL GLASS SM (MISCELLANEOUS) IMPLANT
DERMABOND ADVANCED (GAUZE/BANDAGES/DRESSINGS) ×1
DERMABOND ADVANCED .7 DNX12 (GAUZE/BANDAGES/DRESSINGS) ×1 IMPLANT
DRAPE EXTREMITY T 121X128X90 (DISPOSABLE) ×2 IMPLANT
DRAPE OEC MINIVIEW 54X84 (DRAPES) ×2 IMPLANT
DRAPE SHEET LG 3/4 BI-LAMINATE (DRAPES) ×2 IMPLANT
DRAPE SURG 17X23 STRL (DRAPES) ×2 IMPLANT
ELECT REM PT RETURN 9FT ADLT (ELECTROSURGICAL) ×2
ELECTRODE REM PT RTRN 9FT ADLT (ELECTROSURGICAL) ×1 IMPLANT
GAUZE 4X4 16PLY ~~LOC~~+RFID DBL (SPONGE) ×2 IMPLANT
GAUZE SPONGE 4X4 12PLY STRL (GAUZE/BANDAGES/DRESSINGS) ×2 IMPLANT
GAUZE SPONGE 4X4 12PLY STRL LF (GAUZE/BANDAGES/DRESSINGS) ×1 IMPLANT
GLOVE SURG ENC MOIS LTX SZ7.5 (GLOVE) ×2 IMPLANT
GOWN STRL REUS W/TWL LRG LVL3 (GOWN DISPOSABLE) ×2 IMPLANT
K-WIRE CAPS STERILE WHITE .045 (WIRE) ×1 IMPLANT
K-WIRE DBL END TROCAR 6X.045 (WIRE) ×6
K-WIRE DBL END TROCAR 6X.062 (WIRE) ×2
KWIRE DBL END TROCAR 6X.045 (WIRE) IMPLANT
KWIRE DBL END TROCAR 6X.062 (WIRE) IMPLANT
NDL HYPO 25X1 1.5 SAFETY (NEEDLE) ×1 IMPLANT
NEEDLE HYPO 25X1 1.5 SAFETY (NEEDLE) ×2 IMPLANT
NS IRRIG 1000ML POUR BTL (IV SOLUTION) ×2 IMPLANT
PACK BASIN DAY SURGERY FS (CUSTOM PROCEDURE TRAY) ×2 IMPLANT
PAD CAST 4YDX4 CTTN HI CHSV (CAST SUPPLIES) ×1 IMPLANT
PAD CAST CTTN 4X4 STRL (SOFTGOODS) IMPLANT
PADDING CAST ABS 4INX4YD NS (CAST SUPPLIES) ×1
PADDING CAST ABS COTTON 4X4 ST (CAST SUPPLIES) ×1 IMPLANT
PADDING CAST COTTON 4X4 STRL (CAST SUPPLIES) ×2
PADDING CAST COTTON 4X4 STRL (SOFTGOODS) ×2
SLING ARM FOAM STRAP LRG (SOFTGOODS) ×1 IMPLANT
STRIP CLOSURE SKIN 1/2X4 (GAUZE/BANDAGES/DRESSINGS) ×2 IMPLANT
SUCTION FRAZIER HANDLE 10FR (MISCELLANEOUS) ×2
SUCTION TUBE FRAZIER 10FR DISP (MISCELLANEOUS) ×1 IMPLANT
SUT FIBERWIRE 2-0 18 17.9 3/8 (SUTURE) ×2
SUT MNCRL AB 4-0 PS2 18 (SUTURE) ×3 IMPLANT
SUTURE FIBERWR 2-0 18 17.9 3/8 (SUTURE) IMPLANT
SYR 10ML LL (SYRINGE) ×2 IMPLANT
SYR BULB IRRIG 60ML STRL (SYRINGE) ×2 IMPLANT
TOWEL OR 17X26 10 PK STRL BLUE (TOWEL DISPOSABLE) ×2 IMPLANT
TRAY DSU PREP LF (CUSTOM PROCEDURE TRAY) ×2 IMPLANT

## 2021-04-20 NOTE — Discharge Instructions (Addendum)
Orthopaedic Hand Surgery Discharge Instructions  WEIGHT BEARING STATUS: Non weight bearing on operative extremity  DRESSINGS: Please keep your dressing/splint/cast clean and dry until your follow-up appointment. You may shower by placing a waterproof covering over your dressing/splint/cast. Contact your surgeon if your splint/cast gets wet. It will need to be changed to prevent skin breakdown.  PAIN CONTROL: First line medications for post operative pain control are Tylenol (acetaminophen) and Motrin (ibuprofen) if you are able to take these medications. If you have been prescribed a medication these can be taken as breakthrough pain medications. Please note that some narcotic pain medication have acetaminophen added and you should never consume more than 4,000mg  of acetaminophen in 24 hour period. Also please note that if you are given Toradol (ketoralac) you should not take similar medications simultaneously such as ibuprofen.   ICE/ELEVATION: Ice and elevate your injured extremity as needed. Avoid direct contact of ice with skin.  HOME MEDICATIONS: No changes have been made to your home medications.  FOLLOW UP: You will be called after surgery with an appointment date and time, however if you have not received a phone call within 3 days please call during regular office hours at [phone] to schedule a post operative appointment.  Please Seek Medical Attention if: Call MD for: pain or pressure in chest, jaw, arm, back, neck  Call MD for: temperature greater than 101 F for more than 24 hours  Call MD for: difficulty breathing Call MD for: Incision redness, bleeding, drainage  Call MD for: palpitations or feeling that the heart is racing  Call MD for: increased swelling in arm, leg, ankle, or abdomen  Call MD for: lightheadedness, dizziness, fainting Go to ED or call 911 if: chest pain does not go away after 3 nitroglycerin doses taken 5 min apart  Go to ED or call 911 for: any  uncontrolled bleeding  Go to ED or call 911 if: unable to reach physician  Discharge Medications: Allergies as of 04/20/2021       Reactions   Avelox [moxifloxacin Hcl In Nacl] Other (See Comments)   "body feels numb"   Eggs Or Egg-derived Products Nausea And Vomiting        Medication List     TAKE these medications    albuterol 108 (90 Base) MCG/ACT inhaler Commonly known as: VENTOLIN HFA Inhale 2 puffs into the lungs every 6 (six) hours as needed for wheezing or shortness of breath.   atorvastatin 40 MG tablet Commonly known as: LIPITOR Take 40 mg by mouth at bedtime.   famotidine 20 MG tablet Commonly known as: PEPCID Take 20 mg by mouth 2 (two) times daily.   FLUoxetine 20 MG tablet Commonly known as: PROZAC Take 20 mg by mouth daily.   fluticasone 50 MCG/ACT nasal spray Commonly known as: FLONASE Place 2 sprays into both nostrils daily as needed for allergies or rhinitis.   hydrochlorothiazide 25 MG tablet Commonly known as: HYDRODIURIL Take 25 mg by mouth daily.   levonorgestrel 20 MCG/DAY Iud Commonly known as: MIRENA 1 each by Intrauterine route once.   loratadine 10 MG tablet Commonly known as: CLARITIN Take 10 mg by mouth daily.   meloxicam 7.5 MG tablet Commonly known as: MOBIC Take 7.5 mg by mouth every evening.   oxyCODONE-acetaminophen 5-325 MG tablet Commonly known as: Percocet Take 1 tablet by mouth every 6 (six) hours as needed for up to 5 days for severe pain.   triamcinolone cream 0.5 % Commonly known as: KENALOG Apply  1 application topically as needed.          Philipp Ovens, MD Orthopaedic Hand Surgery  Regional Anesthesia Blocks  1. Numbness or the inability to move the "blocked" extremity may last from 3-48 hours after placement. The length of time depends on the medication injected and your individual response to the medication. If the numbness is not going away after 48 hours, call your surgeon.  2. The extremity  that is blocked will need to be protected until the numbness is gone and the  Strength has returned. Because you cannot feel it, you will need to take extra care to avoid injury. Because it may be weak, you may have difficulty moving it or using it. You may not know what position it is in without looking at it while the block is in effect.  3. For blocks in the legs and feet, returning to weight bearing and walking needs to be done carefully. You will need to wait until the numbness is entirely gone and the strength has returned. You should be able to move your leg and foot normally before you try and bear weight or walk. You will need someone to be with you when you first try to ensure you do not fall and possibly risk injury.  4. Bruising and tenderness at the needle site are common side effects and will resolve in a few days.  5. Persistent numbness or new problems with movement should be communicated to the surgeon or the [facility] [phone] [facility] [phone]).  Post Anesthesia Home Care Instructions  Activity: Get plenty of rest for the remainder of the day. A responsible individual must stay with you for 24 hours following the procedure.  For the next 24 hours, DO NOT: -Drive a car -Advertising copywriter -Drink alcoholic beverages -Take any medication unless instructed by your physician -Make any legal decisions or sign important papers.  Meals: Start with liquid foods such as gelatin or soup. Progress to regular foods as tolerated. Avoid greasy, spicy, heavy foods. If nausea and/or vomiting occur, drink only clear liquids until the nausea and/or vomiting subsides. Call your physician if vomiting continues.  Special Instructions/Symptoms: Your throat may feel dry or sore from the anesthesia or the breathing tube placed in your throat during surgery. If this causes discomfort, gargle with warm salt water. The discomfort should disappear within 24  hours.  If you had a scopolamine patch placed behind your ear for the management of post- operative nausea and/or vomiting:  1. The medication in the patch is effective for 72 hours, after which it should be removed.  Wrap patch in a tissue and discard in the trash. Wash hands thoroughly with soap and water. 2. You may remove the patch earlier than 72 hours if you experience unpleasant side effects which may include dry mouth, dizziness or visual disturbances. 3. Avoid touching the patch. Wash your hands with soap and water after contact with the patch.

## 2021-04-20 NOTE — Progress Notes (Signed)
Assisted Dr. Foster with right, ultrasound guided, supraclavicular block. Side rails up, monitors on throughout procedure. See vital signs in flow sheet. Tolerated Procedure well. °

## 2021-04-20 NOTE — Anesthesia Preprocedure Evaluation (Addendum)
Anesthesia Evaluation  Patient identified by MRN, date of birth, ID band Patient awake    Reviewed: Allergy & Precautions, NPO status , Patient's Chart, lab work & pertinent test results  Airway Mallampati: III  TM Distance: >3 FB Neck ROM: Full  Mouth opening: Limited Mouth Opening  Dental no notable dental hx. (+) Teeth Intact, Dental Advisory Given Orthodontic appliances upper and lower teeth:   Pulmonary neg pulmonary ROS,    Pulmonary exam normal breath sounds clear to auscultation       Cardiovascular hypertension, Pt. on medications Normal cardiovascular exam Rhythm:Regular Rate:Normal     Neuro/Psych Anxiety negative neurological ROS     GI/Hepatic Neg liver ROS, GERD  Medicated and Controlled,  Endo/Other  Hyperlipidemia Obesity  Renal/GU negative Renal ROS  negative genitourinary   Musculoskeletal Right scapulolunate ligament rupture   Abdominal (+) + obese,   Peds  Hematology negative hematology ROS (+)   Anesthesia Other Findings   Reproductive/Obstetrics                           Anesthesia Physical Anesthesia Plan  ASA: 2  Anesthesia Plan: Regional and MAC   Post-op Pain Management:    Induction: Intravenous  PONV Risk Score and Plan: 3 and Ondansetron, Propofol infusion, Midazolam and Treatment may vary due to age or medical condition  Airway Management Planned: Natural Airway, Simple Face Mask and Nasal Cannula  Additional Equipment:   Intra-op Plan:   Post-operative Plan:   Informed Consent: I have reviewed the patients History and Physical, chart, labs and discussed the procedure including the risks, benefits and alternatives for the proposed anesthesia with the patient or authorized representative who has indicated his/her understanding and acceptance.     Dental advisory given  Plan Discussed with: CRNA and Anesthesiologist  Anesthesia Plan Comments:         Anesthesia Quick Evaluation

## 2021-04-20 NOTE — Interval H&P Note (Signed)
History and Physical Interval Note:  04/20/2021 9:08 AM  Melinda Valentine  has presented today for surgery, with the diagnosis of right scapholunate ligament rupture.  The various methods of treatment have been discussed with the patient and family. After consideration of risks, benefits and other options for treatment, the patient has consented to  Procedure(s) with comments: Right scapholunate ligament repair possible reconstruction with palmaris longus tendon (Right) - REGIONAL with MAC as a surgical intervention.  The patient's history has been reviewed, patient examined, no change in status, stable for surgery.  I have reviewed the patient's chart and labs.  Questions were answered to the patient's satisfaction.     [name]

## 2021-04-20 NOTE — Anesthesia Procedure Notes (Signed)
Anesthesia Regional Block: Supraclavicular block   Pre-Anesthetic Checklist: , timeout performed,  Correct Patient, Correct Site, Correct Laterality,  Correct Procedure, Correct Position, site marked,  Risks and benefits discussed,  Surgical consent,  Pre-op evaluation,  At surgeon's request  Laterality: Right  Prep: chloraprep       Needles:  Injection technique: Single-shot  Needle Type: Echogenic Stimulator Needle     Needle Length: 10cm  Needle Gauge: 21     Additional Needles:     Motor weakness within 3 minutes.  Narrative:  Start time: 04/20/2021 9:55 AM End time: 04/20/2021 10:00 AM Injection made incrementally with aspirations every 5 mL.  Performed by: Personally  Anesthesiologist: Mal Amabile, MD  Additional Notes: Timeout performed. Patient sedated. Relevant anatomy ID'd using [place]. Incremental 2-39ml injection of LA with frequent aspiration. Patient tolerated procedure well.    Right Supraclavicular Block

## 2021-04-20 NOTE — Anesthesia Procedure Notes (Signed)
Procedure Name: MAC Date/Time: 04/20/2021 11:26 AM Performed by: Lieutenant Diego, CRNA Pre-anesthesia Checklist: Patient identified, Emergency Drugs available, Suction available, Patient being monitored and Timeout performed Patient Re-evaluated:Patient Re-evaluated prior to induction Oxygen Delivery Method: Nasal cannula Placement Confirmation: positive ETCO2

## 2021-04-20 NOTE — Anesthesia Postprocedure Evaluation (Addendum)
Anesthesia Post Note  Patient: Takerra Lupinacci  Procedure(s) Performed: Right scapholunate ligament repair (Right: Wrist)     Patient location during evaluation: PACU Anesthesia Type: Regional and MAC Level of consciousness: awake and alert and oriented Pain management: pain level controlled Vital Signs Assessment: post-procedure vital signs reviewed and stable Respiratory status: spontaneous breathing, nonlabored ventilation and respiratory function stable Cardiovascular status: stable and blood pressure returned to baseline Postop Assessment: no apparent nausea or vomiting Anesthetic complications: no   No notable events documented.  Last Vitals:  Vitals:   04/20/21 1015 04/20/21 1303  BP: (!) 145/88 (!) 133/92  Pulse: 73 78  Resp: 17 20  Temp:  36.4 C  SpO2: 96% 96%    Last Pain:  Vitals:   04/20/21 1333  TempSrc:   PainSc: 0-No pain                 Reena Borromeo A.

## 2021-04-20 NOTE — Transfer of Care (Signed)
Immediate Anesthesia Transfer of Care Note  Patient: Melinda Valentine  Procedure(s) Performed: Right scapholunate ligament repair (Right: Wrist)  Patient Location: PACU  Anesthesia Type:MAC and Regional  Level of Consciousness: awake  Airway & Oxygen Therapy: Patient Spontanous Breathing and Patient connected to nasal cannula oxygen  Post-op Assessment: Report given to RN and Post -op Vital signs reviewed and stable  Post vital signs: Reviewed and stable  Last Vitals:  Vitals Value Taken Time  BP 133/92 04/20/21 1303  Temp 36.4 C 04/20/21 1303  Pulse 78 04/20/21 1308  Resp 24 04/20/21 1308  SpO2 97 % 04/20/21 1308  Vitals shown include unvalidated device data.  Last Pain:  Vitals:   04/20/21 0935  TempSrc: Oral  PainSc: 0-No pain      Patients Stated Pain Goal: 3 (53/00/51 1021)  Complications: No notable events documented.

## 2021-04-21 ENCOUNTER — Encounter (HOSPITAL_BASED_OUTPATIENT_CLINIC_OR_DEPARTMENT_OTHER): Payer: Self-pay | Admitting: Orthopedic Surgery

## 2021-04-21 NOTE — Op Note (Signed)
OPERATIVE NOTE  DATE OF PROCEDURE: 04/20/2021  SURGEONS:  Primary: [name], MD  PREOPERATIVE DIAGNOSIS: right scapholunate ligament rupture  POSTOPERATIVE DIAGNOSIS: Same  NAME OF PROCEDURE:   Right scapholunate ligament repair with internal brace Right extensor pollicis longus transposition (67341) Right posterior interosseous nerve neurectomy (93790) Right wrist intraoperative radiographs four view with interpretation  ANESTHESIA: Regional  SKIN PREPARATION: Hibiclens  ESTIMATED BLOOD LOSS: Minimal  IMPLANTS:  Implant Name Type Inv. Item Serial No. Manufacturer Lot No. LRB No. Used Action  KIT HAND WRIST LIGAMENT AUG - [id number] Orthopedic Implant KIT HAND WRIST LIGAMENT AUG  [employer] [id number] Right 1 Implanted  ANCHOR DX SWIVELOCK SL 3.5X8.5 - [id number] Anchor ANCHOR DX SWIVELOCK SL 3.5X8.5  [employer] [id number] Right 1 Implanted   INDICATIONS:  Melinda Valentine is a 45 y.o. female who has the above preoperative diagnosis. The patient has decided to proceed with surgical intervention.  Risks, benefits and alternatives of operative management were discussed including, but not limited to, risks of anesthesia complications, infection, pain, persistent symptoms, stiffness, need for future surgery.  The patient understands, agrees and elects to proceed with surgery.    DESCRIPTION OF PROCEDURE: The patient was met in the pre-operative area and their identity was verified.  The operative location and laterality was also verified and marked.  The patient was brought to the OR and was placed supine on the table.  After repeat patient identification with the operative team anesthesia was provided and the patient was prepped and draped in the usual sterile fashion.  A final timeout was performed verifying the correction patient, procedure, location and laterality.  The right upper extremity was elevated and tourniquet was inflated. A longitudinal incision was made over right wrist. Skin and  subcutaneous tissues were divided and hemostasis was obtained. The extensor pollicis longus tendon was identified, the sheath was release and an extensor pollicis longus tendon transposition was performed, transferring the tendon to a superficial and radial position. At this time attention was turned to identification of the posterior interosseous nerve at the floor of the fourth extensor compartment. This was isolated and a neurectomy was performed by removing a 1 cm segment of the nerve and maintaining hemostasis. Then the interval between the second and fourth extensor compartments was identified and inverted T shaped capsulotomy was performed. The scapholunate ligament interval was identified and the SL ligament was avulsed off the lunate. Joystick K-wires were placed and the SL was reduced out of DISI. The k wires for the Ar[employer] internal brace SL repair were placed in the proximal scaphoid, lunate and distal scaphoid. The reduction of the SL diastasis and scaphoid flexion was performed. The K-wires were overdrilled and the suture tape anchors were placed at all three locations with excellent purchase.  A scaphocapitate K-wire was placed via mini-open approach to protect the neurovascular structure. C-arm fluoroscopy of the wrist four views confirmed excellent reduction of the scapholunate ligament interval and placement of the hardware. The wound was irrigated and then attention turned to closure. 3-0 vicryl was used for capsular closure. The extensor retinaculum was then repaired leaving the EPL superficial and radial via transposition. Hemostasis was obtained and then the skin was closed with 4-0 nylon horizontal mattress suture technique. A sterile soft dressing was applied followed by a short arm volar plaster splint. The tourniquet was deflated and the fingers turned pink and warm and well-perfused. All counts were correct x2. The patient was brought to recovery in stable condition.    Roselind Rily  Greta Doom, MD

## 2021-09-27 ENCOUNTER — Emergency Department (HOSPITAL_COMMUNITY): Payer: Commercial Managed Care - PPO

## 2021-09-27 ENCOUNTER — Other Ambulatory Visit: Payer: Self-pay

## 2021-09-27 ENCOUNTER — Emergency Department (HOSPITAL_COMMUNITY)
Admission: EM | Admit: 2021-09-27 | Discharge: 2021-09-27 | Disposition: A | Discharge status: Home / Self Care | Payer: Commercial Managed Care - PPO | Attending: Emergency Medicine | Admitting: Emergency Medicine

## 2021-09-27 DIAGNOSIS — I10 Essential (primary) hypertension: Secondary | ICD-10-CM | POA: Diagnosis not present

## 2021-09-27 DIAGNOSIS — M25531 Pain in right wrist: Secondary | ICD-10-CM | POA: Diagnosis not present

## 2021-09-27 DIAGNOSIS — Z8616 Personal history of COVID-19: Secondary | ICD-10-CM | POA: Insufficient documentation

## 2021-09-27 DIAGNOSIS — M545 Low back pain, unspecified: Secondary | ICD-10-CM | POA: Diagnosis present

## 2021-09-27 DIAGNOSIS — Z79899 Other long term (current) drug therapy: Secondary | ICD-10-CM | POA: Insufficient documentation

## 2021-09-27 DIAGNOSIS — Y92410 Unspecified street and highway as the place of occurrence of the external cause: Secondary | ICD-10-CM | POA: Insufficient documentation

## 2021-09-27 MED ORDER — NAPROXEN 500 MG PO TABS: 500.0000 mg | ORAL_TABLET | Freq: Two times a day (BID) | ORAL | 0 refills | Status: AC

## 2021-09-27 MED ORDER — NAPROXEN 250 MG PO TABS
500.0000 mg | ORAL_TABLET | Freq: Once | ORAL | Status: AC
  Administered 2021-09-27: 500 mg via ORAL
  Filled 2021-09-27: qty 2

## 2021-09-27 MED ORDER — CYCLOBENZAPRINE HCL 10 MG PO TABS: 10.0000 mg | ORAL_TABLET | Freq: Two times a day (BID) | ORAL | 0 refills | Status: AC | PRN
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
## 2021-09-27 NOTE — ED Notes (Signed)
EDPA at [facility], pt updated, no changes.  ?
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
## 2021-09-27 NOTE — Discharge Instructions (Addendum)
Please take naproxen and Flexeril as needed for pain and muscle tightness.  This will take several days for this to resolve.  I would like for you to follow-up with the orthopedist with regards to your right wrist.  Return to the emergency department for any worsening symptoms you might have. ?
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
## 2021-09-27 NOTE — ED Notes (Signed)
Back from x ray/CT

## 2022-04-05 ENCOUNTER — Other Ambulatory Visit: Payer: Self-pay | Admitting: Obstetrics and Gynecology

## 2022-04-05 DIAGNOSIS — Z8249 Family history of ischemic heart disease and other diseases of the circulatory system: Secondary | ICD-10-CM

## 2023-04-09 ENCOUNTER — Other Ambulatory Visit (HOSPITAL_BASED_OUTPATIENT_CLINIC_OR_DEPARTMENT_OTHER): Payer: Self-pay | Admitting: Obstetrics and Gynecology

## 2023-04-09 DIAGNOSIS — Z8249 Family history of ischemic heart disease and other diseases of the circulatory system: Secondary | ICD-10-CM

## 2023-04-19 ENCOUNTER — Ambulatory Visit (HOSPITAL_BASED_OUTPATIENT_CLINIC_OR_DEPARTMENT_OTHER)
Admission: RE | Admit: 2023-04-19 | Discharge: 2023-04-19 | Disposition: A | Discharge status: Home / Self Care | Payer: Commercial Managed Care - PPO | Source: Ambulatory Visit | Attending: Obstetrics and Gynecology | Admitting: Obstetrics and Gynecology

## 2023-04-19 DIAGNOSIS — Z8249 Family history of ischemic heart disease and other diseases of the circulatory system: Secondary | ICD-10-CM | POA: Insufficient documentation
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
